# Patient Record
Sex: Male | Born: 1975 | Race: Black or African American | Hispanic: No | Marital: Single | Smoking: Current every day smoker
Health system: Southern US, Community
[De-identification: ages and names within clinical notes are randomized; demographics above are authoritative.]

---

## 1999-05-12 ENCOUNTER — Emergency Department (HOSPITAL_COMMUNITY): Admission: EM | Admit: 1999-05-12 | Discharge: 1999-05-12 | Payer: Self-pay | Admitting: Emergency Medicine

## 1999-05-12 ENCOUNTER — Encounter: Payer: Self-pay | Admitting: Emergency Medicine

## 2001-09-02 ENCOUNTER — Emergency Department (HOSPITAL_COMMUNITY): Admission: EM | Admit: 2001-09-02 | Discharge: 2001-09-02 | Payer: Self-pay | Admitting: Emergency Medicine

## 2004-11-09 ENCOUNTER — Emergency Department (HOSPITAL_COMMUNITY): Admission: EM | Admit: 2004-11-09 | Discharge: 2004-11-09 | Payer: Self-pay | Admitting: Emergency Medicine

## 2005-03-03 ENCOUNTER — Emergency Department (HOSPITAL_COMMUNITY): Admission: EM | Admit: 2005-03-03 | Discharge: 2005-03-03 | Payer: Self-pay | Admitting: Emergency Medicine

## 2007-11-02 ENCOUNTER — Emergency Department (HOSPITAL_COMMUNITY): Admission: EM | Admit: 2007-11-02 | Discharge: 2007-11-02 | Payer: Self-pay | Admitting: Emergency Medicine

## 2009-07-21 ENCOUNTER — Emergency Department (HOSPITAL_COMMUNITY): Admission: EM | Admit: 2009-07-21 | Discharge: 2009-07-21 | Payer: Self-pay | Admitting: Emergency Medicine
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
## 2011-07-17 ENCOUNTER — Emergency Department (HOSPITAL_COMMUNITY)
Admission: EM | Admit: 2011-07-17 | Discharge: 2011-07-17 | Disposition: A | Discharge status: Home / Self Care | Payer: Self-pay | Attending: Emergency Medicine | Admitting: Emergency Medicine

## 2011-07-17 DIAGNOSIS — K6289 Other specified diseases of anus and rectum: Secondary | ICD-10-CM | POA: Insufficient documentation

## 2012-07-29 ENCOUNTER — Encounter (HOSPITAL_COMMUNITY): Payer: Self-pay | Admitting: *Deleted

## 2012-07-29 ENCOUNTER — Emergency Department (HOSPITAL_COMMUNITY)
Admission: EM | Admit: 2012-07-29 | Discharge: 2012-07-29 | Disposition: A | Discharge status: Home / Self Care | Payer: Self-pay | Attending: Emergency Medicine | Admitting: Emergency Medicine

## 2012-07-29 DIAGNOSIS — L02215 Cutaneous abscess of perineum: Secondary | ICD-10-CM

## 2012-07-29 DIAGNOSIS — L02219 Cutaneous abscess of trunk, unspecified: Secondary | ICD-10-CM | POA: Insufficient documentation

## 2012-07-29 DIAGNOSIS — M25519 Pain in unspecified shoulder: Secondary | ICD-10-CM | POA: Insufficient documentation

## 2012-07-29 MED ORDER — POLYETHYLENE GLYCOL 3350 17 GM/SCOOP PO POWD: 17.0000 g | Freq: Every day | ORAL | Status: AC

## 2012-07-29 MED ORDER — AMOXICILLIN-POT CLAVULANATE 875-125 MG PO TABS: 1.0000 | ORAL_TABLET | Freq: Two times a day (BID) | ORAL | Status: AC

## 2012-07-29 MED ORDER — OXYCODONE-ACETAMINOPHEN 5-325 MG PO TABS: 1.0000 | ORAL_TABLET | Freq: Four times a day (QID) | ORAL | Status: AC | PRN

## 2012-07-29 NOTE — ED Notes (Signed)
Pt with right shoulder pain too

## 2012-07-29 NOTE — ED Notes (Signed)
Pt has been having problems with hemorrhoids and noticed knots on rectum getting bigger.

## 2012-07-29 NOTE — ED Provider Notes (Signed)
History    This chart was scribed for [employer]. Rubin Payor, MD, MD by Smitty Pluck. The patient was seen in room [facility] and the patient's care was started at 12:52PM.   CSN: [id number]  Arrival date & time 07/29/12  1204   None     Chief Complaint  Patient presents with  . Hemorrhoids  . Shoulder Pain    (Consider location/radiation/quality/duration/timing/severity/associated sxs/prior treatment) Patient is a 36 y.o. male presenting with shoulder pain. The history is provided by the patient. No language interpreter was used.  Shoulder Pain Pertinent negatives include no shortness of breath.   Miguelangel Tray is a 36 y.o. male with hx of hemorrhoids who presents to the Emergency Department complaining of constant, moderate hemorrhoids onset 1 days ago. Pt reports that he noticed 2 masses externally on rectum. He denies any pain associated with masses. He reports having right shoulder pain onset 2 weeks ago after playing baseball. He denies abdominal pain, diarrhea, constipation and anal bleeding any other pain.     History reviewed. No pertinent past medical history.  History reviewed. No pertinent past surgical history.  No family history on file.  History  Substance Use Topics  . Smoking status: Current Every Day Smoker  . Smokeless tobacco: Not on file  . Alcohol Use: Yes     occ      Review of Systems  Constitutional: Negative for fever and chills.  Respiratory: Negative for shortness of breath.   Gastrointestinal: Negative for nausea and vomiting.  Neurological: Negative for weakness.    Allergies  Review of patient's allergies indicates no known allergies.  Home Medications   Current Outpatient Rx  Name Route Sig Dispense Refill  . ACETAMINOPHEN 325 MG PO TABS Oral Take 650 mg by mouth every 6 (six) hours as needed. For shoulder pain    . IBUPROFEN 200 MG PO TABS Oral Take 200 mg by mouth every 6 (six) hours as needed. For pain    . AMOXICILLIN-POT  CLAVULANATE 875-125 MG PO TABS Oral Take 1 tablet by mouth every 12 (twelve) hours. 14 tablet 0  . OXYCODONE-ACETAMINOPHEN 5-325 MG PO TABS Oral Take 1-2 tablets by mouth every 6 (six) hours as needed for pain. 20 tablet 0  . POLYETHYLENE GLYCOL 3350 PO POWD Oral Take 17 g by mouth daily. 255 g 0    BP 152/98  Pulse 91  Temp 98.1 F (36.7 C) (Oral)  Resp 20  SpO2 99%  Physical Exam  Nursing note and vitals reviewed. Constitutional: He is oriented to person, place, and time. He appears well-developed and well-nourished. No distress.  HENT:  Head: Normocephalic and atraumatic.  Eyes: EOM are normal.  Neck: Neck supple. No tracheal deviation present.  Cardiovascular: Normal rate.   Pulmonary/Chest: Effort normal. No respiratory distress.  Abdominal: There is no tenderness.  Genitourinary: Rectal exam shows external hemorrhoid.       Clear discharge on left lateral side  No clear source of discharge  Possible abscess and infection   Musculoskeletal: Normal range of motion.  Neurological: He is alert and oriented to person, place, and time.  Skin: Skin is warm and dry.  Psychiatric: He has a normal mood and affect. His behavior is normal.    ED Course  Procedures (including critical care time)   COORDINATION OF CARE: 12:57 PM Discussed ED treatment with pt     Labs Reviewed - No data to display No results found.   1. Shoulder pain   2. Perineal  abscess, superficial       MDM  Patient complaining of rectal pain. Has small hemorrhoids, but does have some midrange. No clear abscess. No clear fluctuance. We'll treat with antibiotics and sitz baths and will followup with surgery as needed. Also likely musculoskeletal injury or shoulder.      I personally performed the services described in this documentation, which was scribed in my presence. The recorded information has been reviewed and considered.     Juliet Rude. Rubin Payor, MD 07/29/12 1332

## 2014-03-10 ENCOUNTER — Encounter (HOSPITAL_COMMUNITY): Payer: Self-pay | Admitting: Emergency Medicine

## 2014-03-10 ENCOUNTER — Emergency Department (HOSPITAL_COMMUNITY)
Admission: EM | Admit: 2014-03-10 | Discharge: 2014-03-10 | Disposition: A | Discharge status: Home / Self Care | Payer: Self-pay | Attending: Emergency Medicine | Admitting: Emergency Medicine

## 2014-03-10 DIAGNOSIS — M549 Dorsalgia, unspecified: Secondary | ICD-10-CM

## 2014-03-10 DIAGNOSIS — F17200 Nicotine dependence, unspecified, uncomplicated: Secondary | ICD-10-CM | POA: Insufficient documentation

## 2014-03-10 DIAGNOSIS — IMO0002 Reserved for concepts with insufficient information to code with codable children: Secondary | ICD-10-CM | POA: Insufficient documentation

## 2014-03-10 DIAGNOSIS — Y92410 Unspecified street and highway as the place of occurrence of the external cause: Secondary | ICD-10-CM | POA: Insufficient documentation

## 2014-03-10 DIAGNOSIS — Z792 Long term (current) use of antibiotics: Secondary | ICD-10-CM | POA: Insufficient documentation

## 2014-03-10 DIAGNOSIS — Y9389 Activity, other specified: Secondary | ICD-10-CM | POA: Insufficient documentation

## 2014-03-10 MED ORDER — CYCLOBENZAPRINE HCL 10 MG PO TABS: 10.0000 mg | ORAL_TABLET | Freq: Two times a day (BID) | ORAL | Status: AC | PRN

## 2014-03-10 MED ORDER — MELOXICAM 15 MG PO TABS: 15.0000 mg | ORAL_TABLET | Freq: Every day | ORAL | Status: AC
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
## 2014-03-10 NOTE — ED Notes (Signed)
Pt reports last using cocaine yesterday, alcohol today.
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
## 2014-03-10 NOTE — Discharge Instructions (Signed)
Take Mobic as needed for pain. Take Flexeril as needed for muscle spasm. Refer to attached documents for more information.  °

## 2014-03-14 NOTE — ED Provider Notes (Signed)
Medical screening examination/treatment/procedure(s) were performed by non-physician practitioner and as supervising physician I was immediately available for consultation/collaboration.   EKG Interpretation None       Farida Mcreynolds, MD 03/14/14 1429 

## 2014-06-08 ENCOUNTER — Ambulatory Visit: Payer: Self-pay

## 2017-08-30 ENCOUNTER — Encounter (HOSPITAL_COMMUNITY): Payer: Self-pay

## 2017-08-30 ENCOUNTER — Emergency Department (HOSPITAL_COMMUNITY)
Admission: EM | Admit: 2017-08-30 | Discharge: 2017-08-30 | Disposition: A | Discharge status: Home / Self Care | Payer: Self-pay | Attending: Emergency Medicine | Admitting: Emergency Medicine

## 2017-08-30 DIAGNOSIS — F17210 Nicotine dependence, cigarettes, uncomplicated: Secondary | ICD-10-CM | POA: Insufficient documentation

## 2017-08-30 DIAGNOSIS — L301 Dyshidrosis [pompholyx]: Secondary | ICD-10-CM | POA: Insufficient documentation

## 2017-08-30 DIAGNOSIS — Z79899 Other long term (current) drug therapy: Secondary | ICD-10-CM | POA: Insufficient documentation

## 2017-08-30 DIAGNOSIS — F1410 Cocaine abuse, uncomplicated: Secondary | ICD-10-CM | POA: Insufficient documentation

## 2017-08-30 DIAGNOSIS — F1210 Cannabis abuse, uncomplicated: Secondary | ICD-10-CM | POA: Insufficient documentation

## 2017-08-30 MED ORDER — HYDROCORTISONE 2.5 % EX LOTN: TOPICAL_LOTION | Freq: Two times a day (BID) | CUTANEOUS | 0 refills | Status: AC

## 2017-08-30 MED ORDER — DIPHENHYDRAMINE HCL 25 MG PO CAPS
50.0000 mg | ORAL_CAPSULE | Freq: Once | ORAL | Status: AC
  Administered 2017-08-30: 50 mg via ORAL
  Filled 2017-08-30: qty 2

## 2017-08-30 MED ORDER — PREDNISONE 20 MG PO TABS
60.0000 mg | ORAL_TABLET | Freq: Once | ORAL | Status: AC
  Administered 2017-08-30: 60 mg via ORAL
  Filled 2017-08-30: qty 3

## 2017-08-30 MED ORDER — HYDROXYZINE HCL 25 MG PO TABS: 25.0000 mg | ORAL_TABLET | Freq: Four times a day (QID) | ORAL | 1 refills | Status: AC | PRN

## 2017-08-30 MED ORDER — PREDNISONE 20 MG PO TABS: 40.0000 mg | ORAL_TABLET | Freq: Every day | ORAL | 0 refills | Status: AC

## 2017-08-30 NOTE — Discharge Instructions (Signed)
Use topical hydrocortisone lotion and take prednisone as prescribed. Use Atarax as prescribed for itching. Keep the area moisturized with Vaseline, Aquaphor, Eucerin cream. Avoid contact with chemicals as this will likely worsening your symptoms.

## 2017-08-30 NOTE — ED Provider Notes (Signed)
[facility]-EMERGENCY DEPT Provider Note   CSN: [id number] Arrival date & time: 08/30/17  0127     History   Chief Complaint Chief Complaint  Patient presents with  . Rash    HPI Kenneth Fry is a 41 y.o. male.  The history is provided by the patient. No language interpreter was used.  Rash   This is a new problem. Episode onset: 1 month ago. The problem has been gradually worsening. 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 There has been no fever. The rash is present on the left hand, right hand, left arm and right arm. The pain has been constant since onset. Associated symptoms include blisters and itching. Treatments tried: Neosporin, salacylic acid cream. The treatment provided no relief.    History reviewed. No pertinent past medical history.  There are no active problems to display for this patient.   History reviewed. No pertinent surgical history.     Home Medications    Prior to Admission medications   Medication Sig Start Date End Date Taking? Authorizing Provider  acetaminophen (TYLENOL) 325 MG tablet Take 650 mg by mouth every 6 (six) hours as needed. For shoulder pain    [provider]  amoxicillin-clavulanate (AUGMENTIN) 875-125 MG per tablet Take 1 tablet by mouth every 12 (twelve) hours. 07/29/12   Benjiman CorePickering, Nathan, MD  cyclobenzaprine (FLEXERIL) 10 MG tablet Take 1 tablet (10 mg total) by mouth 2 (two) times daily as needed for muscle spasms. 03/10/14   Emilia BeckSzekalski, Kaitlyn, PA-C  hydrocortisone 2.5 % lotion Apply 2 (two) times daily topically. 08/30/17   Antony MaduraHumes, Mertie Haslem, PA-C  hydrOXYzine (ATARAX/VISTARIL) 25 MG tablet Take 1 tablet (25 mg total) every 6 (six) hours as needed by mouth for itching. 08/30/17   Antony MaduraHumes, Azani Brogdon, PA-C  ibuprofen (ADVIL,MOTRIN) 200 MG tablet Take 200 mg by mouth every 6 (six) hours as needed. For pain    [provider]  meloxicam (MOBIC) 15 MG tablet Take 1 tablet (15 mg total) by mouth daily. 03/10/14   Emilia BeckSzekalski, Kaitlyn, PA-C  oxyCODONE-acetaminophen (PERCOCET/ROXICET) 5-325 MG per tablet Take 1-2 tablets by mouth every 6 (six) hours as needed for pain. 07/29/12   Benjiman CorePickering, Nathan, MD  polyethylene glycol powder (MIRALAX) powder Take 17 g by mouth daily. 07/29/12   Benjiman CorePickering, Nathan, MD  predniSONE (DELTASONE) 20 MG tablet Take 2 tablets (40 mg total) daily by mouth. Take 40 mg by mouth daily for 3 days, then 20mg  by mouth daily for 3 days, then 10mg  daily for 3 days 08/30/17   Antony MaduraHumes, Louella Medaglia, PA-C    Family History History reviewed. No pertinent family history.  Social History Social History   Tobacco Use  . Smoking status: Current Every Day Smoker    Packs/day: 0.50    Types: Cigarettes  . Smokeless tobacco: Never Used  Substance Use Topics  . Alcohol use: Yes    Alcohol/week: 1.2 oz    Types: 2 Cans of beer per week  . Drug use: Yes    Types: Marijuana, Cocaine    Comment: occasional cocaine     Allergies   Hydrocodone   Review of Systems Review of Systems  Skin: Positive for itching and rash.   Ten systems reviewed and are negative for acute change, except as noted in the HPI.    Physical Exam Updated Vital Signs BP (!) 141/122 (BP Location: Left Arm)   Pulse 85  Temp 98.1 F (36.7 C) (Oral)   Resp 20   SpO2 96%   Physical Exam  Constitutional: He is oriented to person, place, and time. He appears well-developed and well-nourished. No distress.  Nontoxic appearing  HENT:  Head: Normocephalic and atraumatic.  No oral lesions or palatal petechiae.  Eyes: Conjunctivae and EOM are normal. No scleral icterus.  Neck: Normal range of motion.  Pulmonary/Chest: Effort normal. No respiratory distress.  Respirations even and unlabored  Musculoskeletal: Normal range of motion.  Neurological: He is alert and oriented to person, place, and time.  Skin: Skin is warm and dry. Rash  noted. He is not diaphoretic. No pallor.  Patient with dry skin to volar wrists with lichenification and areas of excoriation with papulovesicular rash extending to hands and digits.  Area is pruritic.  No pustules, erythema, induration.  Psychiatric: He has a normal mood and affect. His behavior is normal.  Nursing note and vitals reviewed.    ED Treatments / Results  Labs (all labs ordered are listed, but only abnormal results are displayed) Labs Reviewed - No data to display  EKG  EKG Interpretation None       Radiology No results found.  Procedures Procedures (including critical care time)  Medications Ordered in ED Medications  predniSONE (DELTASONE) tablet 60 mg (not administered)  diphenhydrAMINE (BENADRYL) capsule 50 mg (not administered)     Initial Impression / Assessment and Plan / ED Course  I have reviewed the triage vital signs and the nursing notes.  Pertinent labs & imaging results that were available during my care of the patient were reviewed by me and considered in my medical decision making (see chart for details).     Patient with findings consistent with dyshidrotic eczema.  No evidence of secondary infection or cellulitis.  Will start on a course of steroids as well as topical hydrocortisone lotion.  Patient given Atarax for itching.  He has been advised to follow-up with a primary care doctor. Return precautions discussed and provided. Patient discharged in stable condition with no unaddressed concerns.   Final Clinical Impressions(s) / ED Diagnoses   Final diagnoses:  Dyshidrotic eczema    ED Discharge Orders        Ordered    predniSONE (DELTASONE) 20 MG tablet  Daily     08/30/17 0347    hydrOXYzine (ATARAX/VISTARIL) 25 MG tablet  Every 6 hours PRN     08/30/17 0347    hydrocortisone 2.5 % lotion  2 times daily     08/30/17 0347       Antony MaduraHumes, Ezio Wieck, PA-C 08/30/17 0358    Palumbo, April, MD 08/30/17 [phone]

## 2017-08-30 NOTE — ED Triage Notes (Signed)
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

## 2022-02-21 ENCOUNTER — Emergency Department (HOSPITAL_COMMUNITY): Payer: Self-pay

## 2022-02-21 ENCOUNTER — Emergency Department (HOSPITAL_COMMUNITY)
Admission: EM | Admit: 2022-02-21 | Discharge: 2022-02-21 | Discharge status: Against Medical Advice | Payer: Self-pay | Attending: Emergency Medicine | Admitting: Emergency Medicine

## 2022-02-21 ENCOUNTER — Encounter (HOSPITAL_COMMUNITY): Payer: Self-pay

## 2022-02-21 ENCOUNTER — Other Ambulatory Visit: Payer: Self-pay

## 2022-02-21 DIAGNOSIS — M25511 Pain in right shoulder: Secondary | ICD-10-CM | POA: Insufficient documentation

## 2022-02-21 DIAGNOSIS — Z5321 Procedure and treatment not carried out due to patient leaving prior to being seen by health care provider: Secondary | ICD-10-CM | POA: Insufficient documentation

## 2022-02-21 DIAGNOSIS — Y92007 Garden or yard of unspecified non-institutional (private) residence as the place of occurrence of the external cause: Secondary | ICD-10-CM | POA: Insufficient documentation
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
## 2023-06-08 ENCOUNTER — Ambulatory Visit
Admission: EM | Admit: 2023-06-08 | Discharge: 2023-06-08 | Disposition: A | Discharge status: Home / Self Care | Payer: Self-pay

## 2023-06-08 ENCOUNTER — Emergency Department (HOSPITAL_COMMUNITY)
Admission: EM | Admit: 2023-06-08 | Discharge: 2023-06-09 | Disposition: A | Discharge status: Home / Self Care | Payer: Self-pay | Attending: Emergency Medicine | Admitting: Emergency Medicine

## 2023-06-08 ENCOUNTER — Encounter: Payer: Self-pay | Admitting: *Deleted

## 2023-06-08 ENCOUNTER — Other Ambulatory Visit: Payer: Self-pay

## 2023-06-08 DIAGNOSIS — L03211 Cellulitis of face: Secondary | ICD-10-CM | POA: Insufficient documentation

## 2023-06-08 DIAGNOSIS — L0201 Cutaneous abscess of face: Secondary | ICD-10-CM

## 2023-06-08 DIAGNOSIS — D649 Anemia, unspecified: Secondary | ICD-10-CM | POA: Insufficient documentation

## 2023-06-08 DIAGNOSIS — R220 Localized swelling, mass and lump, head: Secondary | ICD-10-CM

## 2023-06-08 NOTE — Discharge Instructions (Signed)
You have a severe infection of your face. Please go to the nearest emergency room as I believe we need more of a workup than we are able to provide here urgent care for this severe infection.

## 2023-06-08 NOTE — ED Notes (Signed)
Patient is being discharged from the Urgent Care and sent to the Emergency Department via POV . Per M. Stanhope, FNP, patient is in need of higher level of care due to infection of face and eye. Patient is aware and verbalizes understanding of plan of care.  Vitals:   06/08/23 1752  BP: (!) 142/88  Pulse: 96  Resp: 20  Temp: 99.2 F (37.3 C)  SpO2: 96%

## 2023-06-08 NOTE — ED Provider Notes (Signed)
Patient presents to urgent care for evaluation of facial pain and swelling. He states pain and swelling was initially localized after he "messed with a hair bump" to his mustache on June 02, 2023.  He started to notice some redness, drainage, and swelling locally to the left face near the hair bump after symptom onset but reports swelling spread upwards into the left cheek and the left eye significantly over the last 24 hours.  Face is warm to touch and very tender.  He has not had any fever or chills, denies vision changes, and denies headache.  Denies recent antibiotic/steroid use.  Facial swelling present, left maxillofacial region warm and firm to touch with likely underlying abscess. Speech is clear.  Afebrile.  Periorbital erythema, warmth, and swelling present.  Recommend further workup in the emergency department setting as we are unable to provide urgent blood work and maxillofacial CT to rule out significant deep space soft tissue infection.  I am concerned that this may require a further workup than we are able to provide urgent care. Patient discharged from urgent care to the nearest emergency department via POV.  Discussed risks of deferring ED visit, patient expresses understanding and agreement with plan.  Discharged from urgent care in stable condition.    Kenneth Fry, [place] 06/08/23 1806

## 2023-06-08 NOTE — ED Triage Notes (Signed)
Pt reports messing with a "hair bump" on his upper lip last Saturday. Yesterday he woke up and left side of his face was swollen up to the eye. Denies fever. NAD. Has been taking ibuprofen. Left side of face swollen with redness around left eye

## 2023-06-08 NOTE — ED Triage Notes (Signed)
Pt. Arrives POV c/o Left sided facial swelling. Pt. States that he had a hair bump that he was picking at that eventually made his face swell up. Pt. Does not take any medications. He was seen at urgent care and sent into the ED for a CT scan.

## 2023-06-09 ENCOUNTER — Other Ambulatory Visit: Payer: Self-pay

## 2023-06-09 ENCOUNTER — Emergency Department (HOSPITAL_COMMUNITY): Payer: Self-pay

## 2023-06-09 ENCOUNTER — Encounter (HOSPITAL_COMMUNITY): Payer: Self-pay

## 2023-06-09 LAB — LACTIC ACID, PLASMA: Lactic Acid, Venous: 0.6 mmol/L (ref 0.5–1.9)

## 2023-06-09 LAB — CBC WITH DIFFERENTIAL/PLATELET
Abs Immature Granulocytes: 0.02 10*3/uL (ref 0.00–0.07)
Basophils Absolute: 0 10*3/uL (ref 0.0–0.1)
Basophils Relative: 0 %
Eosinophils Absolute: 0.3 10*3/uL (ref 0.0–0.5)
Eosinophils Relative: 3 %
HCT: 36.8 % — ABNORMAL LOW (ref 39.0–52.0)
Hemoglobin: 11.8 g/dL — ABNORMAL LOW (ref 13.0–17.0)
Immature Granulocytes: 0 %
Lymphocytes Relative: 29 %
Lymphs Abs: 2.9 10*3/uL (ref 0.7–4.0)
MCH: 29.2 pg (ref 26.0–34.0)
MCHC: 32.1 g/dL (ref 30.0–36.0)
MCV: 91.1 fL (ref 80.0–100.0)
Monocytes Absolute: 1 10*3/uL (ref 0.1–1.0)
Monocytes Relative: 10 %
Neutro Abs: 5.7 10*3/uL (ref 1.7–7.7)
Neutrophils Relative %: 58 %
Platelets: 294 10*3/uL (ref 150–400)
RBC: 4.04 MIL/uL — ABNORMAL LOW (ref 4.22–5.81)
RDW: 13.3 % (ref 11.5–15.5)
WBC: 10 10*3/uL (ref 4.0–10.5)
nRBC: 0 % (ref 0.0–0.2)

## 2023-06-09 LAB — BASIC METABOLIC PANEL
Anion gap: 8 (ref 5–15)
BUN: 14 mg/dL (ref 6–20)
CO2: 24 mmol/L (ref 22–32)
Calcium: 8.3 mg/dL — ABNORMAL LOW (ref 8.9–10.3)
Chloride: 105 mmol/L (ref 98–111)
Creatinine, Ser: 0.94 mg/dL (ref 0.61–1.24)
GFR, Estimated: >60 mL/min (ref >60)
Glucose, Bld: 84 mg/dL (ref 70–99)
Potassium: 3.7 mmol/L (ref 3.5–5.1)
Sodium: 137 mmol/L (ref 135–145)

## 2023-06-09 MED ORDER — DOXYCYCLINE HYCLATE 100 MG PO CAPS: 100.0000 mg | ORAL_CAPSULE | Freq: Two times a day (BID) | ORAL | 0 refills | Status: AC

## 2023-06-09 MED ORDER — SODIUM CHLORIDE 0.9 % IV SOLN
2.0000 g | Freq: Once | INTRAVENOUS | Status: AC
  Administered 2023-06-09: 2 g via INTRAVENOUS
  Filled 2023-06-09: qty 20

## 2023-06-09 MED ORDER — IOHEXOL 300 MG/ML  SOLN
75.0000 mL | Freq: Once | INTRAMUSCULAR | Status: AC | PRN
  Administered 2023-06-09: 75 mL via INTRAVENOUS

## 2023-06-09 NOTE — ED Notes (Signed)
Sent urine and culture to lab

## 2023-06-09 NOTE — ED Provider Notes (Signed)
[place] [facility]PARTMENT AT [facility] Provider Note   CSN: [id number] Arrival date & time: 06/08/23  2347     History  Chief Complaint  Patient presents with   Facial Swelling    Kenneth Fry is a 47 y.o. male.  HPI   Patient without significant medical history presenting with complaints of left sided facial swelling.  Patient states that he had a small pimple on his left lower lip, he picked at it on Wednesday, states he had some swelling, but on Thursday got worse, he states today he has more swelling around his face, denies any difficulties eye movement, denies any eye pain, denies any tongue throat lips no difficulty breathing, he has no associate fever chills cough congestion.  Patient has had this in the past.  He states he went to urgent care and told to come here for further assessment.    Home Medications Prior to Admission medications   Medication Sig Start Date End Date Taking? Authorizing Provider  doxycycline (VIBRAMYCIN) 100 MG capsule Take 1 capsule (100 mg total) by mouth 2 (two) times daily for 10 days. 06/09/23 06/19/23 Yes Carroll Sage, PA-C  ibuprofen (ADVIL) 200 MG tablet Take 200 mg by mouth every 6 (six) hours as needed for moderate pain.   Yes [provider]  amoxicillin-clavulanate (AUGMENTIN) 875-125 MG per tablet Take 1 tablet by mouth every 12 (twelve) hours. Patient not taking: Reported on 06/09/2023 07/29/12   Benjiman Core, MD  cyclobenzaprine (FLEXERIL) 10 MG tablet Take 1 tablet (10 mg total) by mouth 2 (two) times daily as needed for muscle spasms. Patient not taking: Reported on 06/09/2023 03/10/14   Emilia Beck, PA-C  hydrocortisone 2.5 % lotion Apply 2 (two) times daily topically. Patient not taking: Reported on 06/09/2023 08/30/17   Antony Madura, PA-C  hydrOXYzine (ATARAX/VISTARIL) 25 MG tablet Take 1 tablet (25 mg total) every 6 (six) hours as needed by mouth for itching. Patient not taking:  Reported on 06/09/2023 08/30/17   Antony Madura, PA-C  meloxicam (MOBIC) 15 MG tablet Take 1 tablet (15 mg total) by mouth daily. Patient not taking: Reported on 06/09/2023 03/10/14   Emilia Beck, PA-C  oxyCODONE-acetaminophen (PERCOCET/ROXICET) 5-325 MG per tablet Take 1-2 tablets by mouth every 6 (six) hours as needed for pain. Patient not taking: Reported on 06/09/2023 07/29/12   Benjiman Core, MD  polyethylene glycol powder [facility]) powder Take 17 g by mouth daily. Patient not taking: Reported on 06/09/2023 07/29/12   Benjiman Core, MD  predniSONE (DELTASONE) 20 MG tablet Take 2 tablets (40 mg total) daily by mouth. Take 40 mg by mouth daily for 3 days, then 20mg  by mouth daily for 3 days, then 10mg  daily for 3 days Patient not taking: Reported on 06/09/2023 08/30/17   Antony Madura, PA-C      Allergies    Hydrocodone    Review of Systems   Review of Systems  Constitutional:  Negative for chills and fever.  Respiratory:  Negative for shortness of breath.   Cardiovascular:  Negative for chest pain.  Gastrointestinal:  Negative for abdominal pain.  Skin:  Positive for rash and wound.  Neurological:  Negative for headaches.    Physical Exam Updated Vital Signs BP 131/88 (BP Location: Left Arm)   Pulse 67   Temp 97.7 F (36.5 C) (Oral)   Resp 20   Ht 5\' 7"  (1.702 m)   Wt 65.8 kg   SpO2 98%   BMI 22.71 kg/m  Physical  Exam Vitals and nursing note reviewed.  Constitutional:      General: He is not in acute distress.    Appearance: He is not ill-appearing.  HENT:     Head: Normocephalic and atraumatic.     Comments: Patient is noted edema on the left side of the face, slight periorbital edema with very minimal erythema, there is a small abrasion above his left upper lip, erythema present without drainage or discharge present.  Patient is nontender to palpation.  Please see picture for full detail    Nose: No congestion.     Mouth/Throat:     Mouth: Mucous membranes are  moist.     Pharynx: Oropharynx is clear.     Comments: No trismus no torticollis no oral edema present, no note no muffled tone voice. Eyes:     Extraocular Movements: Extraocular movements intact.     Conjunctiva/sclera: Conjunctivae normal.     Pupils: Pupils are equal, round, and reactive to light.  Cardiovascular:     Rate and Rhythm: Normal rate and regular rhythm.     Pulses: Normal pulses.     Heart sounds: No murmur heard.    No friction rub. No gallop.  Pulmonary:     Effort: No respiratory distress.     Breath sounds: No wheezing, rhonchi or rales.  Skin:    General: Skin is warm and dry.  Neurological:     Mental Status: He is alert.  Psychiatric:        Mood and Affect: Mood normal.     ED Results / Procedures / Treatments   Labs (all labs ordered are listed, but only abnormal results are displayed) Labs Reviewed  CBC WITH DIFFERENTIAL/PLATELET - Abnormal; Notable for the following components:      Result Value   RBC 4.04 (*)    Hemoglobin 11.8 (*)    HCT 36.8 (*)    All other components within normal limits  BASIC METABOLIC PANEL - Abnormal; Notable for the following components:   Calcium 8.3 (*)    All other components within normal limits  LACTIC ACID, PLASMA  LACTIC ACID, PLASMA    EKG None  Radiology CT Maxillofacial W Contrast  Result Date: 06/09/2023 CLINICAL DATA:  Nasal abscess.  Face swelling EXAM: CT MAXILLOFACIAL WITH CONTRAST TECHNIQUE: Multidetector CT imaging of the maxillofacial structures was performed with intravenous contrast. Multiplanar CT image reconstructions were also generated. RADIATION DOSE REDUCTION: This exam was performed according to the departmental dose-optimization program which includes automated exposure control, adjustment of the mA and/or kV according to patient size and/or use of iterative reconstruction technique. CONTRAST:  75mL OMNIPAQUE IOHEXOL 300 MG/ML  SOLN COMPARISON:  None Available. FINDINGS: Osseous: No  fracture or erosion. There are dental caries but no Peri pickle lucency of note in the vicinity of left facial collection. Torus mandibularis. Orbits: Negative. No traumatic or inflammatory finding. Sinuses: Clear. Soft tissues: Cellulitis centered at the mid face with tubelike collection extending from the anterior left maxilla inferiorly to the subcutaneous left cheek, approximately 4 cm in length and up to 5 mm in diameter. Limited intracranial: Negative IMPRESSION: Facial cellulitis with track like collection traversing the left cheek, as above no underlying odontogenic source or sinusitis. Electronically Signed   By: Tiburcio Pea M.D.   On: 06/09/2023 05:24    Procedures Procedures    Medications Ordered in ED Medications  cefTRIAXone (ROCEPHIN) 2 g in sodium chloride 0.9 % 100 mL IVPB (0 g Intravenous Stopped 06/09/23  9562)  iohexol (OMNIPAQUE) 300 MG/ML solution 75 mL (75 mLs Intravenous Contrast Given 06/09/23 1308)    ED Course/ Medical Decision Making/ A&P                                 Medical Decision Making Amount and/or Complexity of Data Reviewed Labs: ordered. Radiology: ordered.  Risk Prescription drug management.   This patient presents to the ED for concern of rash, this involves an extensive number of treatment options, and is a complaint that carries with it a high risk of complications and morbidity.  The differential diagnosis includes orbital cellulitis, cellulitis, facial abscess,    Additional history obtained:  Additional history obtained from N/A External records from outside source obtained and reviewed including urgent care notes   Co morbidities that complicate the patient evaluation  N/A  Social Determinants of Health:  No PCP    Lab Tests:  I Ordered, and personally interpreted labs.  The pertinent results include: CBC shows normocytic anemia hemoglobin 0.8, BMP reveals calcium 8.3, lactic 0.6   Imaging Studies ordered:  I ordered  imaging studies including CT maxillofacial with contrast I independently visualized and interpreted imaging which showed facial cellulitis with tubelike structure I agree with the radiologist interpretation   Cardiac Monitoring:  The patient was maintained on a cardiac monitor.  I personally viewed and interpreted the cardiac monitored which showed an underlying rhythm of: N/A   Medicines ordered and prescription drug management:  I ordered medication including ceftriaxone I have reviewed the patients home medicines and have made adjustments as needed  Critical Interventions:  N/A   Reevaluation:  Presents with edema and erythema of the left aspect of face, suspect likely overlying cellulitis, I am concerned for possible deep tissue infection, will send her for CT imaging for further assessment.  Starting antibiotics.  CT scan reveals tubelike structure as well as cellulitis, will consult ENT for further recommendations  Updated patient on this plan he is agreement this and is ready for discharge    Consultations Obtained:  I requested consultation with the spoke with Dr bats.,  and discussed lab and imaging findings as well as pertinent plan - they recommend: Patient patient on antibiotics which covers for MRSA like doxycycline, and can follow-up in his office about a week's time    Test Considered:  N/A    Rule out Suspicion for orbital or periorbital cellulitis low at this time no noted proptosis, EOMs are fully intact, CT scan sinuses findings.  I doubt drainable abscess is no fluid collection on my exam, or seen on CT imaging.  I doubt dental infection as a dental denies any dental infection during my examination.      Dispostion and problem list  After consideration of the diagnostic results and the patients response to treatment, I feel that the patent would benefit from discharge.  Facial cellulitis-started on antibiotics, Flagyl and follow-up with ENT for  further evaluation and strict return precautions.            Final Clinical Impression(s) / ED Diagnoses Final diagnoses:  Facial cellulitis    Rx / DC Orders ED Discharge Orders          Ordered    doxycycline (VIBRAMYCIN) 100 MG capsule  2 times daily        06/09/23 0609              Berle Mull  J, PA-C 06/09/23 9147    Dione Booze, MD 06/09/23 [phone]

## 2023-06-09 NOTE — Discharge Instructions (Addendum)
You have a infection on your face, starting on antibiotics please take as prescribed.  May use over-the-counter pain medication as needed.  please follow-up with ENT within a week's time for further assessment.  He notes that symptoms or not improving after 3 to 4 days of antibiotic use, if pain with eye movement, you are unable to open up your eye, you have any tongue throat lip swelling difficulty breathing, or your symptoms are simply worsening please come back in for reassessment.
